# Patient Record
Sex: Male | Born: 1965 | Race: White | Hispanic: No | Marital: Married | Smoking: Never smoker
Health system: Southern US, Community
[De-identification: ages and names within clinical notes are randomized; demographics above are authoritative.]

## PROBLEM LIST (undated history)

## (undated) HISTORY — PX: HERNIA REPAIR: SHX51

## (undated) HISTORY — PX: WISDOM TOOTH EXTRACTION: SHX21

---

## 2005-10-23 ENCOUNTER — Ambulatory Visit: Payer: Self-pay | Admitting: Family Medicine

## 2006-11-06 ENCOUNTER — Ambulatory Visit: Payer: Self-pay | Admitting: Family Medicine

## 2006-11-06 LAB — CONVERTED CEMR LAB
ALT: 50 units/L — ABNORMAL HIGH (ref 0–40)
Albumin: 4.1 g/dL (ref 3.5–5.2)
Alkaline Phosphatase: 45 units/L (ref 39–117)
BUN: 6 mg/dL (ref 6–23)
Basophils Absolute: 0 10*3/uL (ref 0.0–0.1)
Basophils Relative: 0.3 % (ref 0.0–1.0)
CO2: 29 meq/L (ref 19–32)
Chol/HDL Ratio, serum: 5.1
Cholesterol: 170 mg/dL (ref 0–200)
GFR calc non Af Amer: 88 mL/min
Glomerular Filtration Rate, Af Am: 107 mL/min/{1.73_m2}
Glucose, Bld: 98 mg/dL (ref 70–99)
Monocytes Relative: 6.3 % (ref 3.0–11.0)
Platelets: 220 10*3/uL (ref 150–400)
Potassium: 4.8 meq/L (ref 3.5–5.1)
RDW: 12.1 % (ref 11.5–14.6)
Total Bilirubin: 1.4 mg/dL — ABNORMAL HIGH (ref 0.3–1.2)
Total Protein: 6.8 g/dL (ref 6.0–8.3)
WBC: 4.7 10*3/uL (ref 4.5–10.5)

## 2006-11-13 ENCOUNTER — Ambulatory Visit: Payer: Self-pay | Admitting: Family Medicine

## 2007-08-19 DIAGNOSIS — E785 Hyperlipidemia, unspecified: Secondary | ICD-10-CM

## 2009-01-23 ENCOUNTER — Ambulatory Visit: Payer: Self-pay | Admitting: Internal Medicine

## 2009-01-23 DIAGNOSIS — J069 Acute upper respiratory infection, unspecified: Secondary | ICD-10-CM | POA: Insufficient documentation

## 2009-06-26 ENCOUNTER — Telehealth: Payer: Self-pay | Admitting: Family Medicine

## 2010-01-21 ENCOUNTER — Telehealth: Payer: Self-pay | Admitting: Family Medicine

## 2010-01-22 ENCOUNTER — Telehealth: Payer: Self-pay | Admitting: Family Medicine

## 2010-09-18 ENCOUNTER — Encounter: Payer: Self-pay | Admitting: Family Medicine

## 2010-12-17 NOTE — Letter (Signed)
Summary: [facility]  [facility]   Imported By: Maryln Gottron 09/27/2010 10:39:56  _____________________________________________________________________  External Attachment:    Type:   Image     Comment:   External Document

## 2010-12-17 NOTE — Progress Notes (Signed)
Summary: ? regarding eye  Phone Note Call from Patient   Caller: Patient Call For: Nelwyn Salisbury MD Summary of Call: Pt is calling concerned he is not 100 percent better (pink eye) after 24 hours of drops.  Advised as long as he is seeing improvement daily, to continue the drops, and call if vision changes or symptoms increase. Initial call taken by: Lynann Beaver CMA,  January 22, 2010 4:03 PM  Follow-up for Phone Call        agreed Follow-up by: Nelwyn Salisbury MD,  January 22, 2010 4:54 PM

## 2010-12-17 NOTE — Progress Notes (Signed)
Summary: ?pinkeye  Phone Note Call from Patient Call back at Work Phone [phone]   Caller: Brandon Davenport Call For: Brandon Salisbury MD Summary of Call: C/o R eye and stinging & watery, ?pinkeye; can't come in and wonders if you'll send Rx.  Had same thing few months ago [facility]  ph [phone] Initial call taken by: VM  Follow-up for Phone Call        [facility] with more info and pharmacy Follow-up by: Raechel Ache, RN,  January 21, 2010 12:09 PM  Additional Follow-up for Phone Call Additional follow up Details #1::        call in Sulfacetamide eye drops, 2 drops q 4 hours as needed , 10 ml with no rf Additional Follow-up by: Brandon Salisbury MD,  January 21, 2010 1:19 PM    Prescriptions: SULFACETAMIDE SODIUM 10 % SOLN (SULFACETAMIDE SODIUM) 2 drops in infected eye every 4 hours as needed  #65mL x 0   Entered by:   Raechel Ache, RN   Authorized by:   Brandon Salisbury MD   Signed by:   Raechel Ache, RN on 01/21/2010   Method used:   Electronically to        [id number] (retail)       [address]       [place], [place]  [address]       Ph: [phone]       Fax: [phone]   RxID:   [id number]

## 2011-04-04 NOTE — Assessment & Plan Note (Signed)
[facility] OFFICE NOTE   NAME:Los, NAI DASCH                    MRN:          [id number]  DATE:11/13/2006                            DOB:          09-08-1966    This is a 45 year old gentleman here for a complete physical  examination.  He is doing well and has no complaints at all.  He has  been taking Propecia for the last several years for hair loss, and he  would like to con.  For details of his past medical history, family  history, social habits, etc., I refer you to our introductory note with  him date April 28, 2003.   ALLERGIES:  None.   CURRENT MEDICATIONS:  Propecia 1 mg per day.   OBJECTIVE:  VITAL SIGNS:  Height 5 feet 11 inches, weight 200, BP  120/72, pulse 64 and regular.  GENERAL:  He appears to be quite healthy.  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  SKIN:  Clear.  HEENT:  Eyes clear, ears clear, pharynx clear.  NECK:  Supple without lymphadenopathy or masses.  LUNGS:  Clear.  CARDIAC:  Rate and rhythm regular without gallops, murmurs, rubs.  Distal pulses full.  ABDOMEN:  Soft, normal bowel sounds, nontender, no masses.  GENITALIA:  Normal male.  He is circumcised.  EXTREMITIES:  No clubbing, cyanosis, or edema.  NEUROLOGIC:  Grossly intact.   He was here for fasting labs on December 21.  These were all within  normal limits except for his lipid panel.  HDL was low at 33 and LDL was  high at 115.   ASSESSMENT AND PLAN:  1. Complete physical.  Will encourage him to repeat this on a yearly      basis.  2. Hair loss.  I refilled Propecia for the coming year.  3. Elevated lipids.  We discussed dietary changes he could make.     Tera Mater. Clent Ridges, MD  Electronically Signed    SAF/MedQ  DD: 11/16/2006  DT: 11/16/2006  Job #: [id number]

## 2016-02-09 ENCOUNTER — Ambulatory Visit (INDEPENDENT_AMBULATORY_CARE_PROVIDER_SITE_OTHER): Payer: 59 | Admitting: Family Medicine

## 2016-02-09 ENCOUNTER — Ambulatory Visit (INDEPENDENT_AMBULATORY_CARE_PROVIDER_SITE_OTHER): Payer: 59

## 2016-02-09 VITALS — BP 122/76 | HR 122 | Temp 98.9°F | Resp 18 | Ht 72.0 in | Wt 199.0 lb

## 2016-02-09 DIAGNOSIS — R05 Cough: Secondary | ICD-10-CM

## 2016-02-09 DIAGNOSIS — R509 Fever, unspecified: Secondary | ICD-10-CM

## 2016-02-09 DIAGNOSIS — R059 Cough, unspecified: Secondary | ICD-10-CM

## 2016-02-09 LAB — POCT CBC
Granulocyte percent: 83.8 %G — AB (ref 37–80)
HCT, POC: 42.2 % — AB (ref 43.5–53.7)
Hemoglobin: 15.3 g/dL (ref 14.1–18.1)
Lymph, poc: 0.7 (ref 0.6–3.4)
MCH, POC: 32.4 pg — AB (ref 27–31.2)
MCHC: 36.3 g/dL — AB (ref 31.8–35.4)
MCV: 89.4 fL (ref 80–97)
MID (cbc): 0.4 (ref 0–0.9)
MPV: 7.8 fL (ref 0–99.8)
POC Granulocyte: 5.4 (ref 2–6.9)
POC LYMPH PERCENT: 10.8 %L (ref 10–50)
POC MID %: 5.4 %M (ref 0–12)
Platelet Count, POC: 142 10*3/uL (ref 142–424)
RBC: 4.72 M/uL (ref 4.69–6.13)
RDW, POC: 12.7 %
WBC: 6.5 10*3/uL (ref 4.6–10.2)

## 2016-02-09 MED ORDER — HYDROCODONE-HOMATROPINE 5-1.5 MG/5ML PO SYRP: 5.0000 mL | ORAL_SOLUTION | Freq: Three times a day (TID) | ORAL | Status: DC | PRN

## 2016-02-09 MED ORDER — OSELTAMIVIR PHOSPHATE 75 MG PO CAPS: 75.0000 mg | ORAL_CAPSULE | Freq: Two times a day (BID) | ORAL | Status: DC

## 2016-02-09 MED ORDER — AZITHROMYCIN 250 MG PO TABS: ORAL_TABLET | ORAL | Status: DC

## 2016-02-09 NOTE — Progress Notes (Signed)
Subjective:    Patient ID: Brandon Davenport, male    DOB: 02-02-1966, 50 y.o.   MRN: [id number] By signing my name below, I, Brandon Davenport, attest that this documentation has been prepared under the direction and in the presence of Elvina Sidle, MD. Electronically Signed: Soijett Davenport, ED Scribe. 02/09/2016. 9:10 AM.   Chief Complaint  Patient presents with  . Fever    x 3 days  . Nasal Congestion    HPI  Brandon Davenport is a 50 y.o. male who presents to [facility] complaining of fever of 102 x 3 days. Pt notes that his fever has been persistent since its onset at night primarily. He states that he is having associated symptoms of generalized body aches, productive painful cough, nasal congestion, sore throat due to cough, and appetite change. He states that he has not tried any medications for the relief for his symptoms. He denies diarrhea, abdominal pain, rhinorrhea, SOB, and any other symptoms. Pt denies smoking cigarettes at this time. Denies PMHx of asthma or taking daily medications.   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  History reviewed. No pertinent past medical history.  No Known Allergies  No current outpatient prescriptions on file prior to visit.   No current facility-administered medications on file prior to visit.    Review of Systems  Constitutional: Positive for fever and appetite change.  HENT: Positive for congestion.   Respiratory: Positive for cough. Negative for shortness of breath.   Cardiovascular: Negative for chest pain.  Gastrointestinal: Negative for vomiting, abdominal pain and diarrhea.  Musculoskeletal: Positive for myalgias.  All other systems reviewed and are negative.      Objective:   Physical Exam  Constitutional: He is oriented to person, place, and time. He appears well-developed and well-nourished. No distress.  HENT:  Head: Normocephalic and atraumatic.  Mouth/Throat: Oropharynx is clear and moist.  Eyes: EOM are  normal.  Neck: Neck supple.  Cardiovascular: Normal rate.   Pulmonary/Chest: Effort normal. No respiratory distress. He has rhonchi. He has rales.  Rales and rhonchi in the left base.   Musculoskeletal: Normal range of motion.  Neurological: He is alert and oriented to person, place, and time.  Skin: Skin is warm and dry.  Psychiatric: He has a normal mood and affect. His behavior is normal.  Nursing note and vitals reviewed.   Results for orders placed or performed in visit on 02/09/16  POCT CBC  Result Value Ref Range   WBC 6.5 4.6 - 10.2 K/uL   Lymph, poc 0.7 0.6 - 3.4   POC LYMPH PERCENT 10.8 10 - 50 %L   MID (cbc) 0.4 0 - 0.9   POC MID % 5.4 0 - 12 %M   POC Granulocyte 5.4 2 - 6.9   Granulocyte percent 83.8 (A) 37 - 80 %G   RBC 4.72 4.69 - 6.13 M/uL   Hemoglobin 15.3 14.1 - 18.1 g/dL   HCT, POC 11.9 (A) 14.7 - 53.7 %   MCV 89.4 80 - 97 fL   MCH, POC 32.4 (A) 27 - 31.2 pg   MCHC 36.3 (A) 31.8 - 35.4 g/dL   RDW, POC 82.9 %   Platelet Count, POC 142 142 - 424 K/uL   MPV 7.8 0 - 99.8 fL   Chest x-ray shows marked left bronchial thickening but no definite infiltrate, normal cardiac silhouette, no effusion     BP 122/76 mmHg  Pulse 122  Temp(Src) 98.9 F (37.2 C)  Resp 18  Ht 6' (1.829 m)  Wt 199 lb (90.266 kg)  BMI 26.98 kg/m2  SpO2 97%   Assessment & Plan:   This chart was scribed in my presence and reviewed by me personally.    ICD-9-CM ICD-10-CM   1. Cough 786.2 R05 POCT CBC     DG Chest 2 View     azithromycin (ZITHROMAX) 250 MG tablet     HYDROcodone-homatropine (HYCODAN) 5-1.5 MG/5ML syrup     oseltamivir (TAMIFLU) 75 MG capsule  2. Fever, unspecified fever cause 780.60 R50.9 POCT CBC     DG Chest 2 View     azithromycin (ZITHROMAX) 250 MG tablet     HYDROcodone-homatropine (HYCODAN) 5-1.5 MG/5ML syrup     oseltamivir (TAMIFLU) 75 MG capsule     Signed, Elvina SidleKurt Kammi Hechler, MD

## 2016-02-09 NOTE — Patient Instructions (Addendum)
I suspect this may have begun with the flu, but your developing a pneumonia so I am going to give you medication for both flu and a bacterial infection. In addition of ordered some cough medicine which should help control some the symptoms. He can certainly take ibuprofen or Tylenol keep the fever down while these medicines are getting the infection under control

## 2017-04-11 IMAGING — CR DG CHEST 2V
2 series · 2 of 2 positions shown · non-contrast
Comparison: None.

CLINICAL DATA: 49-year-old male with a history of cough

EXAM:
CHEST - 2 VIEW

[PA]
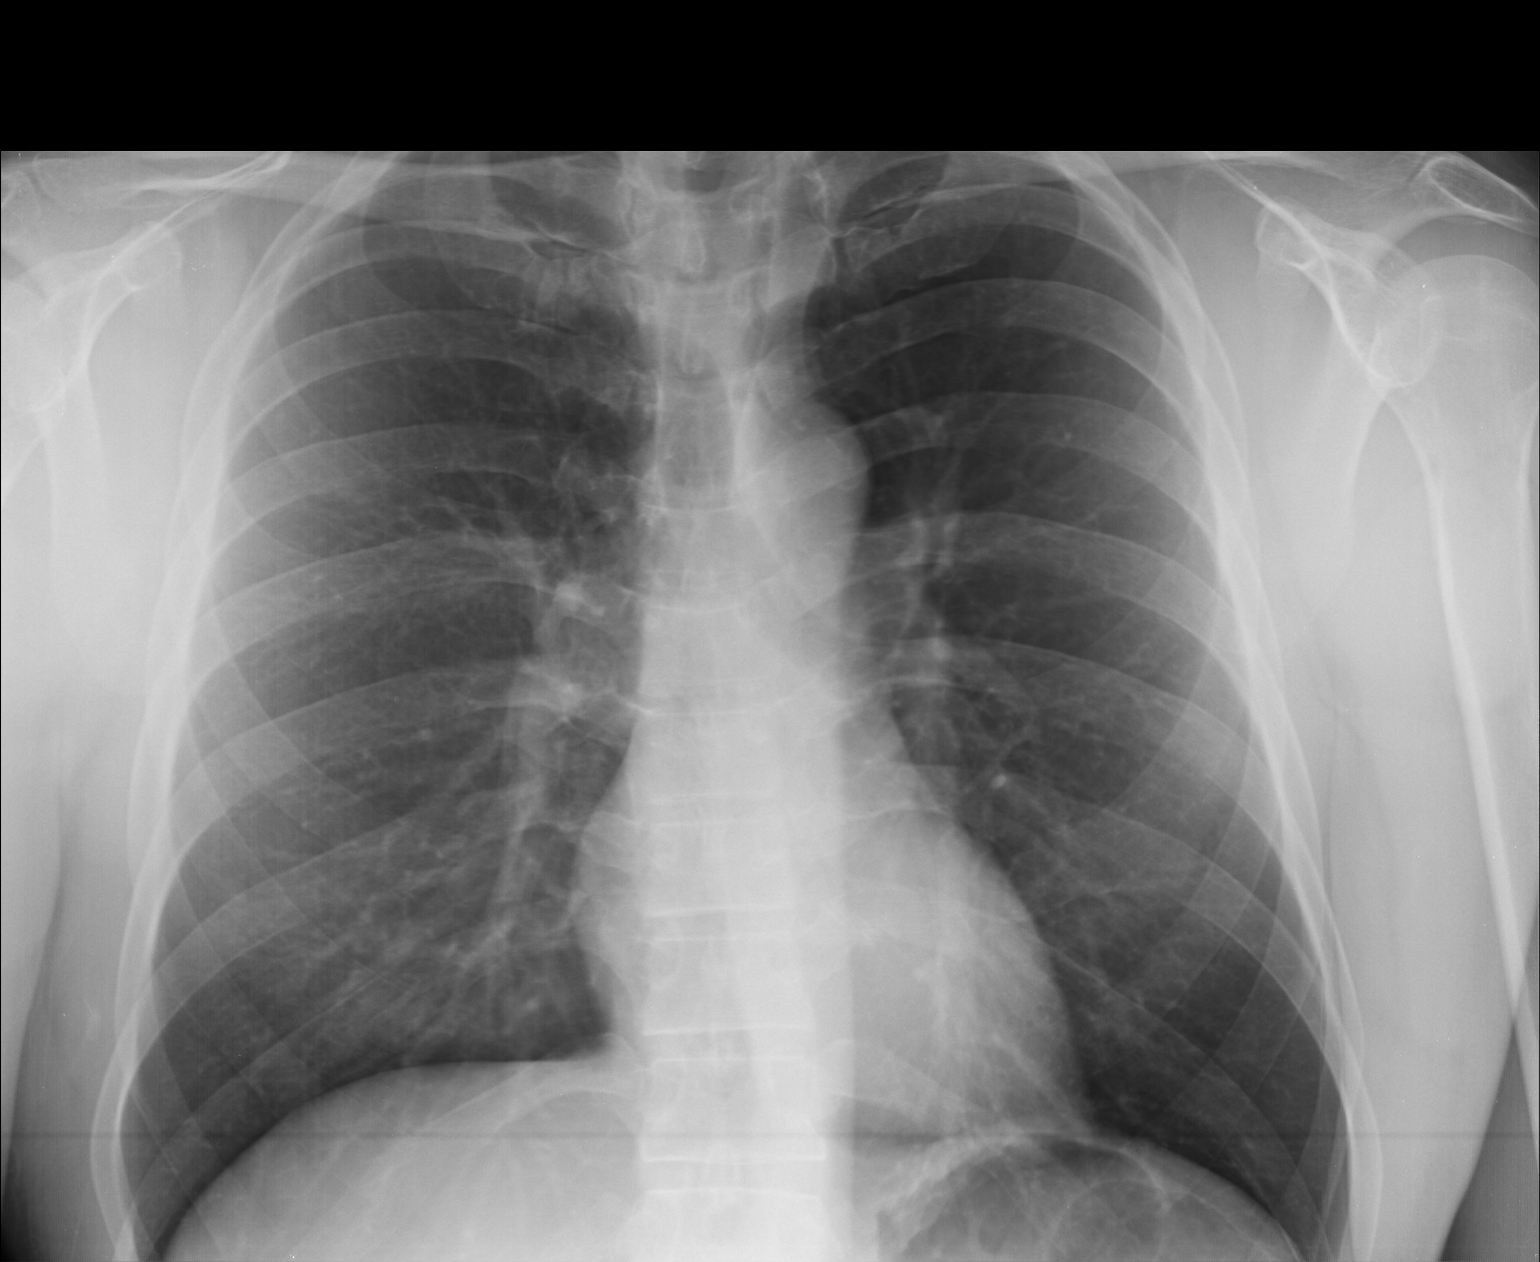

[lateral]
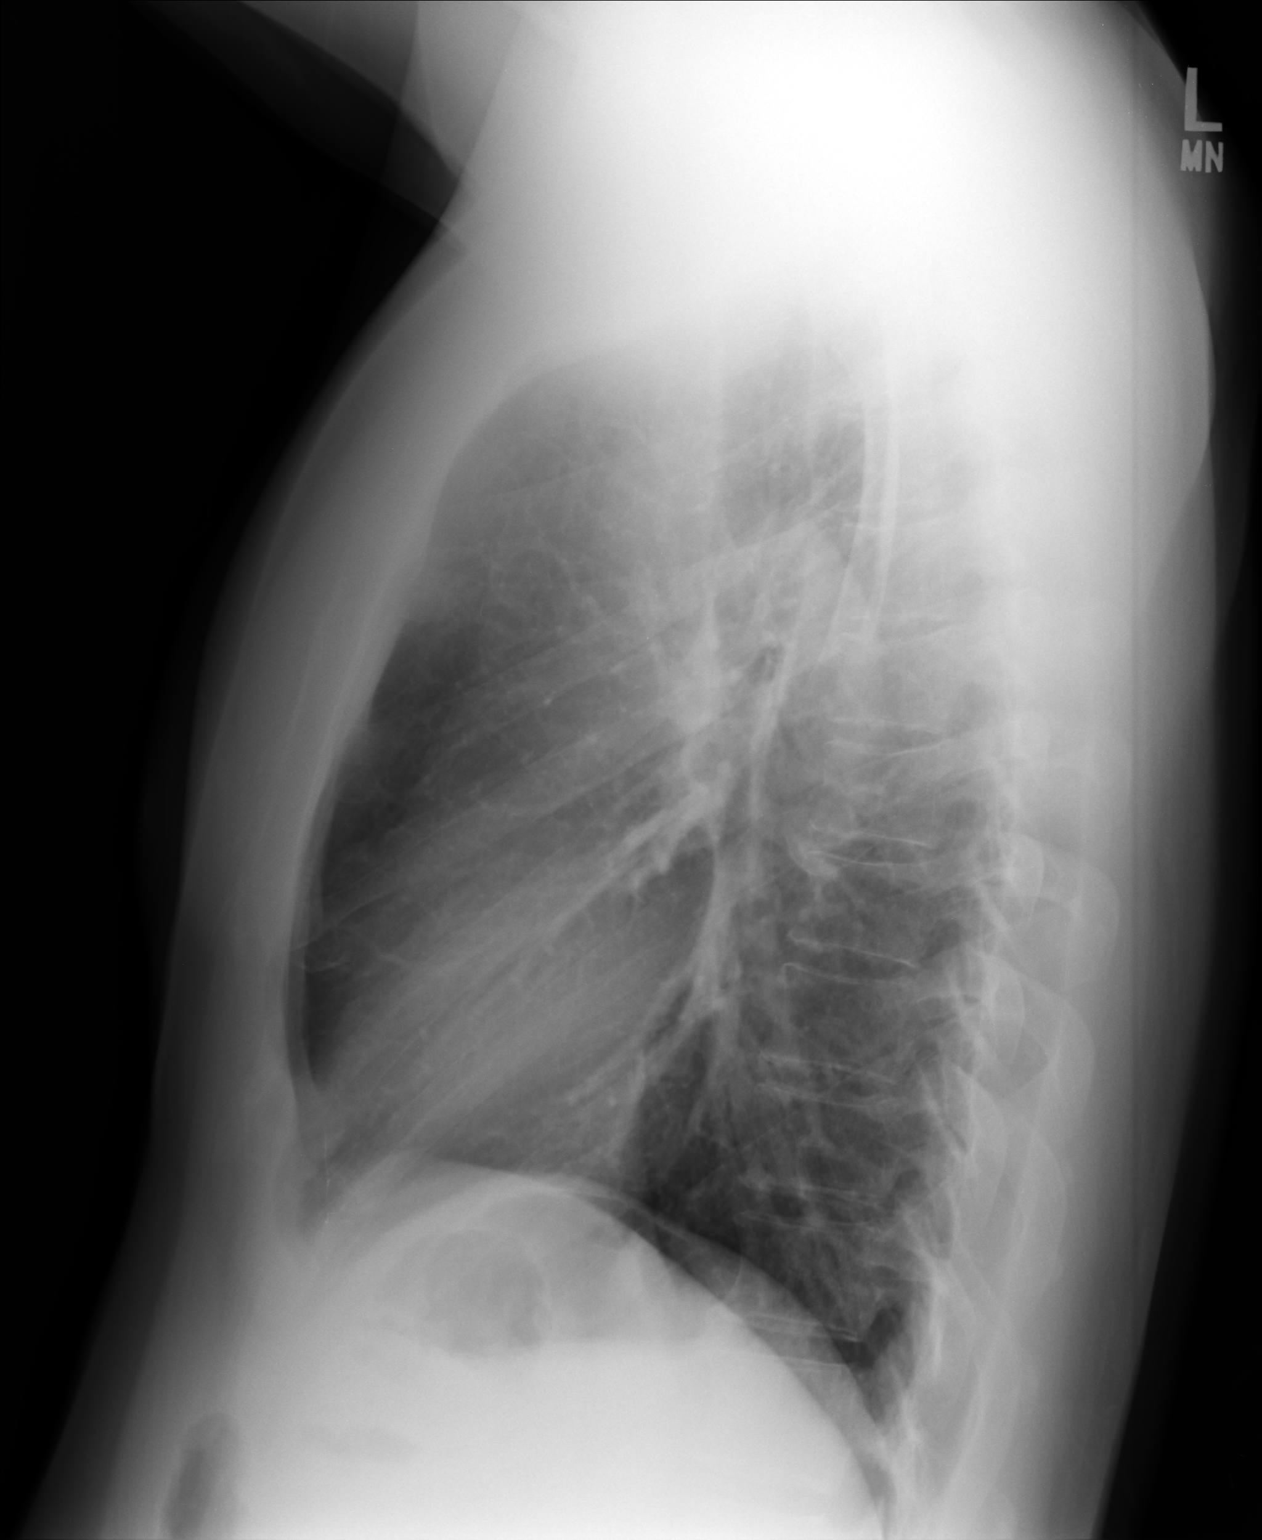

[2 of 2 positions shown; findings below may reference images not displayed]

FINDINGS: Cardiomediastinal silhouette projects within normal limits in size
and contour. No confluent airspace disease, pneumothorax, or pleural
effusion.

No displaced fracture.

Unremarkable appearance of the upper abdomen.
IMPRESSION: No radiographic evidence of acute cardiopulmonary disease.

## 2017-08-06 ENCOUNTER — Encounter: Payer: Self-pay | Admitting: Family Medicine

## 2020-03-27 ENCOUNTER — Encounter: Payer: Self-pay | Admitting: Gastroenterology

## 2020-04-26 ENCOUNTER — Other Ambulatory Visit: Payer: Self-pay

## 2020-04-26 ENCOUNTER — Ambulatory Visit (AMBULATORY_SURGERY_CENTER): Payer: Self-pay | Admitting: *Deleted

## 2020-04-26 VITALS — Ht 71.5 in | Wt 197.0 lb

## 2020-04-26 DIAGNOSIS — Z1211 Encounter for screening for malignant neoplasm of colon: Secondary | ICD-10-CM

## 2020-04-26 NOTE — Progress Notes (Signed)
Completed covid vacc x 2 03-15-20  No egg or soy allergy known to patient  No issues with past sedation with any surgeries  or procedures, no intubation problems  No diet pills per patient No home 02 use per patient  No blood thinners per patient  Pt denies issues with constipation  No A fib or A flutter  EMMI video sent to pt's e mail   Due to the COVID-19 pandemic we are asking patients to follow these guidelines. Please only bring one care partner. Please be aware that your care partner may wait in the car in the parking lot or if they feel like they will be too hot to wait in the car, they may wait in the lobby on the 4th floor. All care partners are required to wear a mask the entire time (we do not have any that we can provide them), they need to practice social distancing, and we will do a Covid check for all patient's and care partners when you arrive. Also we will check their temperature and your temperature. If the care partner waits in their car they need to stay in the parking lot the entire time and we will call them on their cell phone when the patient is ready for discharge so they can bring the car to the front of the building. Also all patient's will need to wear a mask into building.

## 2020-05-09 ENCOUNTER — Encounter: Payer: Self-pay | Admitting: Gastroenterology

## 2020-05-18 ENCOUNTER — Encounter: Payer: Self-pay | Admitting: Gastroenterology

## 2020-05-18 ENCOUNTER — Other Ambulatory Visit: Payer: Self-pay

## 2020-05-18 ENCOUNTER — Ambulatory Visit (AMBULATORY_SURGERY_CENTER): Payer: 59 | Admitting: Gastroenterology

## 2020-05-18 VITALS — BP 112/76 | HR 61 | Temp 96.9°F | Resp 10 | Ht 71.0 in | Wt 197.0 lb

## 2020-05-18 DIAGNOSIS — Z1211 Encounter for screening for malignant neoplasm of colon: Secondary | ICD-10-CM

## 2020-05-18 MED ORDER — SODIUM CHLORIDE 0.9 % IV SOLN: 500.0000 mL | INTRAVENOUS | Status: AC

## 2020-05-18 NOTE — Progress Notes (Signed)
PT taken to PACU. Monitors in place. VSS. Report given to RN. 

## 2020-05-18 NOTE — Progress Notes (Signed)
Vs CW I have reviewed the patient's medical history in detail and updated the computerized patient record.   

## 2020-05-18 NOTE — Patient Instructions (Signed)
YOU HAD AN ENDOSCOPIC PROCEDURE TODAY AT THE [facility]:   Refer to the procedure report that was given to you for any specific questions about what was found during the examination.  If the procedure report does not answer your questions, please call your gastroenterologist to clarify.  If you requested that your care partner not be given the details of your procedure findings, then the procedure report has been included in a sealed envelope for you to review at your convenience later.  YOU SHOULD EXPECT: Some feelings of bloating in the abdomen. Passage of more gas than usual.  Walking can help get rid of the air that was put into your GI tract during the procedure and reduce the bloating. If you had a lower endoscopy (such as a colonoscopy or flexible sigmoidoscopy) you may notice spotting of blood in your stool or on the toilet paper. If you underwent a bowel prep for your procedure, you may not have a normal bowel movement for a few days.  Please Note:  You might notice some irritation and congestion in your nose or some drainage.  This is from the oxygen used during your procedure.  There is no need for concern and it should clear up in a day or so.  SYMPTOMS TO REPORT IMMEDIATELY:   Following lower endoscopy (colonoscopy or flexible sigmoidoscopy):  Excessive amounts of blood in the stool  Significant tenderness or worsening of abdominal pains  Swelling of the abdomen that is new, acute  Fever of 100F or higher   For urgent or emergent issues, a gastroenterologist can be reached at any hour by calling [phone]. Do not use MyChart messaging for urgent concerns.    DIET:  We do recommend a small meal at first, but then you may proceed to your regular diet.  Drink plenty of fluids but you should avoid alcoholic beverages for 24 hours.  ACTIVITY:  You should plan to take it easy for the rest of today and you should NOT DRIVE or use heavy machinery until tomorrow (because  of the sedation medicines used during the test).    MEDICATIONS: Continue present medications.  FOLLOW UP: Our staff will call the number listed on your records 48-72 hours following your procedure to check on you and address any questions or concerns that you may have regarding the information given to you following your procedure. If we do not reach you, we will leave a message.  We will attempt to reach you two times.  During this call, we will ask if you have developed any symptoms of COVID 19. If you develop any symptoms (ie: fever, flu-like symptoms, shortness of breath, cough etc.) before then, please call [phone].  If you test positive for Covid 19 in the 2 weeks post procedure, please call and report this information to [place].    If any biopsies were taken you will be contacted by phone or by letter within the next 1-3 weeks.  Please call us at [phone] if you have not heard about the biopsies in 3 weeks.   Thank you for allowing [place] to provide for your healthcare needs today.   SIGNATURES/CONFIDENTIALITY: You and/or your care partner have signed paperwork which will be entered into your electronic medical record.  These signatures attest to the fact that that the information above on your After Visit Summary has been reviewed and is understood.  Full responsibility of the confidentiality of this discharge information lies with you and/or your care-partner.

## 2020-05-18 NOTE — Op Note (Signed)
[facility] Patient Name: Brandon Davenport Procedure Date: 05/18/2020 9:29 AM MRN: [id number] Endoscopist: Sherilyn Cooter L. Myrtie Neither , MD Age: 54 Referring MD:  Date of Birth: [birth year] Gender: Male Account #: [id number] Procedure:                Colonoscopy Indications:              Screening for colorectal malignant neoplasm, This                            is the patient's first colonoscopy Medicines:                Monitored Anesthesia Care Procedure:                Pre-Anesthesia Assessment:                           - Prior to the procedure, a History and Physical                            was performed, and patient medications and                            allergies were reviewed. The patient's tolerance of                            previous anesthesia was also reviewed. The risks                            and benefits of the procedure and the sedation                            options and risks were discussed with the patient.                            All questions were answered, and informed consent                            was obtained. Prior Anticoagulants: The patient has                            taken no previous anticoagulant or antiplatelet                            agents. ASA Grade Assessment: I - A normal, healthy                            patient. After reviewing the risks and benefits,                            the patient was deemed in satisfactory condition to                            undergo the procedure.  After obtaining informed consent, the colonoscope                            was passed under direct vision. Throughout the                            procedure, the patient's blood pressure, pulse, and                            oxygen saturations were monitored continuously. The                            Colonoscope was introduced through the anus and                            advanced to the the cecum, identified by                             appendiceal orifice and ileocecal valve. The                            colonoscopy was performed without difficulty. The                            patient tolerated the procedure well. The quality                            of the bowel preparation was good after lavage. The                            ileocecal valve, appendiceal orifice, and rectum                            were photographed. The bowel preparation used was                            Miralax. Scope In: 9:37:52 AM Scope Out: 9:55:54 AM Scope Withdrawal Time: 0 hours 14 minutes 33 seconds  Total Procedure Duration: 0 hours 18 minutes 2 seconds  Findings:                 The perianal and digital rectal examinations were                            normal.                           The entire examined colon appeared normal on direct                            and retroflexion views. Complications:            No immediate complications. Estimated Blood Loss:     Estimated blood loss: none. Impression:               - The entire examined colon is normal on  direct and                            retroflexion views.                           - No specimens collected. Recommendation:           - Patient has a contact number available for                            emergencies. The signs and symptoms of potential                            delayed complications were discussed with the                            patient. Return to normal activities tomorrow.                            Written discharge instructions were provided to the                            patient.                           - Resume previous diet.                           - Continue present medications.                           - Repeat colonoscopy in 10 years for screening                            purposes. (Suprep or Plenvu for next exam) Burnis Kaser L. Myrtie Neither, MD 05/18/2020 10:00:03 AM This report has been signed  electronically.

## 2020-05-23 ENCOUNTER — Telehealth: Payer: Self-pay

## 2020-05-23 NOTE — Telephone Encounter (Signed)
  Follow up Call-  Call back number 05/18/2020  Post procedure Call Back phone  # [phone]  Permission to leave phone message Yes  Some recent data might be hidden     Patient questions:  Do you have a fever, pain , or abdominal swelling? No. Pain Score  0 *  Have you tolerated food without any problems? Yes.    Have you been able to return to your normal activities? Yes.    Do you have any questions about your discharge instructions: Diet   No. Medications  No. Follow up visit  No.  Do you have questions or concerns about your Care? No.  Actions: * If pain score is 4 or above: 1. No action needed, pain <4.Have you developed a fever since your procedure? no  2.   Have you had an respiratory symptoms (SOB or cough) since your procedure? no  3.   Have you tested positive for COVID 19 since your procedure no  4.   Have you had any family members/close contacts diagnosed with the COVID 19 since your procedure?  no   If yes to any of these questions please route to Laverna Peace, RN and Charlett Lango, RN

## 2022-09-24 ENCOUNTER — Ambulatory Visit (INDEPENDENT_AMBULATORY_CARE_PROVIDER_SITE_OTHER): Payer: BC Managed Care – PPO | Admitting: Podiatry

## 2022-09-24 DIAGNOSIS — L600 Ingrowing nail: Secondary | ICD-10-CM | POA: Diagnosis not present

## 2022-09-24 MED ORDER — NEOMYCIN-POLYMYXIN-HC 3.5-10000-1 OT SUSP: OTIC | 0 refills | Status: DC

## 2022-09-24 NOTE — Patient Instructions (Signed)

## 2022-09-26 ENCOUNTER — Encounter: Payer: Self-pay | Admitting: Podiatry

## 2022-09-26 NOTE — Progress Notes (Signed)
  Subjective:  Patient ID: Brandon Davenport, male    DOB: 1966-05-30,  MRN: [id number]  Chief Complaint  Patient presents with   Ingrown Toenail    NP  left great ingrown toenail, medial border    56 y.o. male presents with the above complaint. History confirmed with patient.   Objective:  Physical Exam: warm, good capillary refill, no trophic changes or ulcerative lesions, normal DP and PT pulses, and normal sensory exam. Left Foot:  Ingrown nail medial border, lateral border less so but still there   Assessment:   1. Ingrowing left great toenail      Plan:  Patient was evaluated and treated and all questions answered.    Ingrown Nail, left -Patient elects to proceed with minor surgery to remove ingrown toenail today. Consent reviewed and signed by patient. -Ingrown nail excised. See procedure note. -Educated on post-procedure care including soaking. Written instructions provided and reviewed. -Rx for Cortisporin sent to pharmacy. -Advised on signs and symptoms of infection developing.  We discussed that the phenol likely will create some redness and edema and tenderness around the nailbed as long as it is localized this is to be expected.  Will return as needed if any infection signs develop  Procedure: Excision of Ingrown Toenail Location: Left 1st toe medial nail borders. Anesthesia: Lidocaine 1% plain; 1.5 mL and Marcaine 0.5% plain; 1.5 mL, digital block. Skin Prep: Betadine. Dressing: Silvadene; telfa; dry, sterile, compression dressing. Technique: Following skin prep, the toe was exsanguinated and a tourniquet was secured at the base of the toe. The affected nail border was freed, split with a nail splitter, and excised. Chemical matrixectomy was then performed with phenol and irrigated out with alcohol. The tourniquet was then removed and sterile dressing applied. Disposition: Patient tolerated procedure well.    Return if symptoms worsen or fail to improve.

## 2022-10-02 ENCOUNTER — Telehealth: Payer: Self-pay | Admitting: Podiatry

## 2022-10-02 ENCOUNTER — Encounter: Payer: Self-pay | Admitting: Podiatry

## 2022-10-02 ENCOUNTER — Ambulatory Visit (INDEPENDENT_AMBULATORY_CARE_PROVIDER_SITE_OTHER): Payer: BC Managed Care – PPO | Admitting: Podiatry

## 2022-10-02 DIAGNOSIS — L600 Ingrowing nail: Secondary | ICD-10-CM

## 2022-10-02 MED ORDER — CEPHALEXIN 500 MG PO CAPS: 500.0000 mg | ORAL_CAPSULE | Freq: Three times a day (TID) | ORAL | 0 refills | Status: DC

## 2022-10-02 MED ORDER — NEOMYCIN-POLYMYXIN-HC 3.5-10000-1 OT SUSP: OTIC | 0 refills | Status: AC

## 2022-10-02 NOTE — Progress Notes (Signed)
  Subjective:  Patient ID: GILMORE LIST, male    DOB: Feb 19, 1966,  MRN: [id number]  Chief Complaint  Patient presents with   Ingrown Toenail    Left foot great toenails, both borders red and swollen -concerned about infection      56 y.o. male returns for post-op check.  He is here from his nail procedure 1 week ago, was doing well until about Monday when he forgot to bandage the toe and could not find his drops.  Was doing soaks once per day  Review of Systems: Negative except as noted in the HPI. Denies N/V/F/Ch.   Objective:  There were no vitals filed for this visit. There is no height or weight on file to calculate BMI. Constitutional Well developed. Well nourished.  Vascular Foot warm and well perfused. Capillary refill normal to all digits.  Calf is soft and supple, no posterior calf or knee pain, negative Homans' sign  Neurologic Normal speech. Oriented to person, place, and time. Epicritic sensation to light touch grossly present bilaterally.  Dermatologic Erythema about the nail matricectomy sites no cellulitis or purulence  Orthopedic: Tenderness to palpation noted about the surgical site.    Assessment:   1. Ingrowing left great toenail    Plan:  Patient was evaluated and treated and all questions answered.  S/p foot surgery left -Recommended he continue using the Cortisporin drops for another 2 weeks I sent a refill of this.  Also recommended Keflex 5 days as a precaution, I think most of his erythema is secondary to the reaction from the phenolic acid and 2 additional weeks of the soaks and ointment should take care of this.  Discussed signs and symptoms of worsening infection he will let me know if he develops anything like this. Return if symptoms worsen or fail to improve.

## 2022-10-02 NOTE — Telephone Encounter (Signed)
Pt Called because he thought he was suppose to get 2 Rx  a antibiotic and something he was suppose to put on his foot  Please Advise

## 2022-10-22 ENCOUNTER — Telehealth: Payer: Self-pay | Admitting: *Deleted

## 2022-10-22 NOTE — Telephone Encounter (Signed)
Patient is calling because his post procedural toe is still very swollen and painful even after taking the antibiotic, please advise.

## 2022-10-22 NOTE — Telephone Encounter (Signed)
Patient was made aware of recommendations, will schedule that f/u appointment.

## 2022-10-24 ENCOUNTER — Other Ambulatory Visit: Payer: Self-pay | Admitting: *Deleted

## 2022-10-24 ENCOUNTER — Telehealth: Payer: Self-pay | Admitting: *Deleted

## 2022-10-24 MED ORDER — CEPHALEXIN 500 MG PO CAPS: 500.0000 mg | ORAL_CAPSULE | Freq: Three times a day (TID) | ORAL | 0 refills | Status: AC

## 2022-10-24 NOTE — Telephone Encounter (Signed)
Yes has been resent and patient updated.

## 2022-10-24 NOTE — Telephone Encounter (Signed)
Patient  is calling to ask if he can get a stronger antibiotic to help until he sees the physician, appointment is on 12/19, and is on the cancellation list, does not want to go that long , still having problems with post procedural toe.Please advise.

## 2022-10-27 ENCOUNTER — Telehealth: Payer: Self-pay | Admitting: *Deleted

## 2022-10-27 MED ORDER — SULFAMETHOXAZOLE-TRIMETHOPRIM 800-160 MG PO TABS: 1.0000 | ORAL_TABLET | Freq: Two times a day (BID) | ORAL | 0 refills | Status: AC

## 2022-10-27 NOTE — Telephone Encounter (Signed)
Patient is wanting clarification on antibiotic sent to pharmacy, he wanted something a little stronger and the one at pharmacy is the same one previously taken,please advise.

## 2022-10-27 NOTE — Telephone Encounter (Signed)
Patient has been updated with instructions concerning the new medication per physician's notes.

## 2022-10-27 NOTE — Telephone Encounter (Signed)
Patient has picked up the new prescription and has already taken over the weekend  the older antibiotic ,should he finish this one (has taken one today and has 2 remaining)before starting the new prescription? Please advise.

## 2022-11-04 ENCOUNTER — Ambulatory Visit (INDEPENDENT_AMBULATORY_CARE_PROVIDER_SITE_OTHER): Payer: BC Managed Care – PPO | Admitting: Podiatry

## 2022-11-04 ENCOUNTER — Telehealth: Payer: Self-pay

## 2022-11-04 DIAGNOSIS — L03032 Cellulitis of left toe: Secondary | ICD-10-CM

## 2022-11-04 DIAGNOSIS — L02612 Cutaneous abscess of left foot: Secondary | ICD-10-CM

## 2022-11-04 MED ORDER — MUPIROCIN 2 % EX OINT: 1.0000 | TOPICAL_OINTMENT | Freq: Two times a day (BID) | CUTANEOUS | 2 refills | Status: AC

## 2022-11-04 NOTE — Telephone Encounter (Signed)
Noted, thanks!

## 2022-11-05 NOTE — Progress Notes (Signed)
  Subjective:  Patient ID: Brandon Davenport, male    DOB: [birth year],  MRN: [id number]  Chief Complaint  Patient presents with   Ingrown Toenail    foot infection, right    56 y.o. male presents with the above complaint. History confirmed with patient.  Follow-up from his procedure, he still had persistent pain drainage and swelling  Objective:  Physical Exam: warm, good capillary refill, no trophic changes or ulcerative lesions, normal DP and PT pulses, normal sensory exam, and both medial and lateral borders have erythema and tenderness, proximal nail fold erythema. Assessment:   1. Cellulitis and abscess of toe of left foot      Plan:  Patient was evaluated and treated and all questions answered.  Still having persistent erythema and tenderness and redness from his procedure.  Suspect most of this is secondary to sensitivity to the phenolic acid.  I recommended debridement and I&D today.  I anesthetized his hallux with lidocaine and Marcaine prepped with Betadine and debrided the nail fold with a curette.  No abscess or purulence was found.  I did take a culture of the underlying tissue.  He will continue to complete the Bactrim prescription.  Mupirocin ointment sent to pharmacy he will dress this twice daily.  I will see him back in 2 weeks for reevaluation   Return in about 2 weeks (around 11/18/2022) for nail re-check.

## 2022-11-07 LAB — WOUND CULTURE
MICRO NUMBER:: 14334657
SPECIMEN QUALITY:: ADEQUATE

## 2022-11-12 ENCOUNTER — Encounter: Payer: Self-pay | Admitting: Podiatry

## 2022-11-13 ENCOUNTER — Encounter: Payer: Self-pay | Admitting: Podiatry

## 2022-11-18 ENCOUNTER — Ambulatory Visit (INDEPENDENT_AMBULATORY_CARE_PROVIDER_SITE_OTHER): Payer: BC Managed Care – PPO | Admitting: Podiatry

## 2022-11-18 DIAGNOSIS — L600 Ingrowing nail: Secondary | ICD-10-CM

## 2022-11-18 DIAGNOSIS — L03032 Cellulitis of left toe: Secondary | ICD-10-CM

## 2022-11-18 DIAGNOSIS — L02612 Cutaneous abscess of left foot: Secondary | ICD-10-CM

## 2022-11-18 NOTE — Patient Instructions (Signed)

## 2022-11-18 NOTE — Progress Notes (Signed)
  Subjective:  Patient ID: Brandon Davenport, male    DOB: June 21, 1966,  MRN: [id number]  Chief Complaint  Patient presents with   Ingrown Toenail    nail check/ pain, swollen, redness, drainage    57 y.o. male presents with the above complaint. History confirmed with patient.  He returns for ongoing follow-up from his procedure, he still had persistent pain drainage and swelling  Objective:  Physical Exam: warm, good capillary refill, no trophic changes or ulcerative lesions, normal DP and PT pulses, normal sensory exam, and both medial and lateral borders have erythema and tenderness, proximal nail fold erythema, overall much improved from previous.  Culture taken last visit shows skin flora Assessment:   1. Cellulitis and abscess of toe of left foot   2. Ingrowing left great toenail      Plan:  Patient was evaluated and treated and all questions answered.  Continues to have slow healing.  Suspect this is likely delayed healing and sensitivity from the phenolic acid still as opposed to true infection.  I did recommend removal of the remaining nail plate today and this was completed after a local digital block with lidocaine and Marcaine using a Freer elevator to remove the remaining nail plate.  The areas of devitalized healing were debrided with a tissue nipper, there is no purulence abscess or evidence of deep infection.  No phenol matricectomy was performed today.  Advised on post care instructions.  Will see him back in 3 weeks.  Change twice daily with Epsom salt soaks and Cortisporin drops.   Return in about 3 weeks (around 12/09/2022) for nail re-check.

## 2022-11-20 ENCOUNTER — Other Ambulatory Visit: Payer: BC Managed Care – PPO

## 2022-11-24 ENCOUNTER — Encounter: Payer: Self-pay | Admitting: Podiatry

## 2022-12-09 ENCOUNTER — Ambulatory Visit (INDEPENDENT_AMBULATORY_CARE_PROVIDER_SITE_OTHER): Payer: BC Managed Care – PPO

## 2022-12-09 DIAGNOSIS — L600 Ingrowing nail: Secondary | ICD-10-CM

## 2022-12-09 NOTE — Progress Notes (Signed)
Patient in office for nail check after total toenail removal of the left hallux.    Patient denies nausea, vomiting, fever, chills and drainage at this time. Patient reports the nailbed is still sore and he is soaked the foot in epsom salt times 3 weeks. Advised patient that there is a scab over the nailbed and it appears to be healed. Patient advised he can stop the epsom salt soaks and leave the nailbed opened to air.   Advised patient to call the office with any concerns or questions. Patient verbalized understanding.
